# Patient Record
Sex: Male | Born: 1965 | Race: Black or African American | Hispanic: No | Marital: Married | Smoking: Current every day smoker
Health system: Southern US, Community
[De-identification: ages and names within clinical notes are randomized; demographics above are authoritative.]

## PROBLEM LIST (undated history)

## (undated) DIAGNOSIS — I10 Essential (primary) hypertension: Secondary | ICD-10-CM

## (undated) DIAGNOSIS — I472 Ventricular tachycardia, unspecified: Secondary | ICD-10-CM

## (undated) DIAGNOSIS — R002 Palpitations: Secondary | ICD-10-CM

## (undated) DIAGNOSIS — Y249XXA Unspecified firearm discharge, undetermined intent, initial encounter: Secondary | ICD-10-CM

## (undated) DIAGNOSIS — W3400XA Accidental discharge from unspecified firearms or gun, initial encounter: Secondary | ICD-10-CM

## (undated) HISTORY — PX: OTHER SURGICAL HISTORY: SHX169

## (undated) HISTORY — PX: FINGER SURGERY: SHX640

---

## 2013-07-04 ENCOUNTER — Encounter (HOSPITAL_COMMUNITY): Payer: Self-pay | Admitting: Emergency Medicine

## 2013-07-04 ENCOUNTER — Emergency Department (HOSPITAL_COMMUNITY)
Admission: EM | Admit: 2013-07-04 | Discharge: 2013-07-04 | Disposition: A | Discharge status: Home / Self Care | Payer: Self-pay | Attending: Emergency Medicine | Admitting: Emergency Medicine

## 2013-07-04 ENCOUNTER — Emergency Department (HOSPITAL_COMMUNITY): Payer: Self-pay

## 2013-07-04 DIAGNOSIS — F17200 Nicotine dependence, unspecified, uncomplicated: Secondary | ICD-10-CM | POA: Insufficient documentation

## 2013-07-04 DIAGNOSIS — Z87828 Personal history of other (healed) physical injury and trauma: Secondary | ICD-10-CM | POA: Insufficient documentation

## 2013-07-04 DIAGNOSIS — R002 Palpitations: Secondary | ICD-10-CM | POA: Insufficient documentation

## 2013-07-04 DIAGNOSIS — I10 Essential (primary) hypertension: Secondary | ICD-10-CM | POA: Insufficient documentation

## 2013-07-04 DIAGNOSIS — Z79899 Other long term (current) drug therapy: Secondary | ICD-10-CM | POA: Insufficient documentation

## 2013-07-04 HISTORY — DX: Ventricular tachycardia, unspecified: I47.20

## 2013-07-04 HISTORY — DX: Palpitations: R00.2

## 2013-07-04 HISTORY — DX: Accidental discharge from unspecified firearms or gun, initial encounter: W34.00XA

## 2013-07-04 HISTORY — DX: Ventricular tachycardia: I47.2

## 2013-07-04 HISTORY — DX: Essential (primary) hypertension: I10

## 2013-07-04 HISTORY — DX: Unspecified firearm discharge, undetermined intent, initial encounter: Y24.9XXA

## 2013-07-04 LAB — POCT I-STAT, CHEM 8
BUN: 32 mg/dL — ABNORMAL HIGH (ref 6–23)
HCT: 43 % (ref 39.0–52.0)
Hemoglobin: 14.6 g/dL (ref 13.0–17.0)
Sodium: 139 mEq/L (ref 135–145)
TCO2: 24 mmol/L (ref 0–100)

## 2013-07-04 MED ORDER — METOPROLOL TARTRATE 50 MG PO TABS: 50.0000 mg | ORAL_TABLET | Freq: Two times a day (BID) | ORAL | Status: DC

## 2013-07-04 NOTE — ED Notes (Signed)
Patient transported to X-ray 

## 2013-07-04 NOTE — ED Notes (Addendum)
C/o palpitations, takes meds (metoprolol) for the same, ran out of meds, new here to [place] from [place], does not have local PCP, PCP in [place] won't send records, has had palpitations for ~ 2 weeks, started taking half doses of meds 2 weeks ago b/c he knew he would run out. (Denies: nvd, fever, dizziness, pain or sob).

## 2013-07-04 NOTE — ED Notes (Addendum)
Patient presents stating he is here because he has been unable to get a physician here in the area because his physician from [place] has not sent his records to a [place] here and it has been 3 months since requested.  States he becomes anxious when he knows that he is going to run out of his medications.  States he has been having palpitations that come and go.  States the palpitations were strong on the way here but have decreased since arriving here.

## 2013-07-04 NOTE — ED Notes (Addendum)
Pt remains in triage for EKG. Pt alert, NAD, calm, interactive, skin W&D, resps e/u, speaking in clear complete sentences.
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
## 2013-07-04 NOTE — ED Provider Notes (Addendum)
Medical screening examination/treatment/procedure(s) were conducted as a shared visit with resident physician and myself.  I personally evaluated the patient during the encounter. I personally reviewed and interpreted the ecg and agree with the residents interpretation.    I interviewed and examined the patient. Lungs are CTAB. Cardiac exam wnl. Abdomen soft. Denies cp or sob. Pt noting inc palpitations since running out of his metoprolol. Labs/ecg non-contrib. Will provide resources to estab w/ pcp, return precautions given.   Junius Argyle, MD 07/04/13 4696  Junius Argyle, MD 07/16/13 1118

## 2013-11-09 ENCOUNTER — Emergency Department (HOSPITAL_COMMUNITY)
Admission: EM | Admit: 2013-11-09 | Discharge: 2013-11-09 | Disposition: A | Discharge status: Home / Self Care | Payer: Self-pay | Attending: Emergency Medicine | Admitting: Emergency Medicine

## 2013-11-09 ENCOUNTER — Encounter (HOSPITAL_COMMUNITY): Payer: Self-pay | Admitting: Emergency Medicine

## 2013-11-09 DIAGNOSIS — I10 Essential (primary) hypertension: Secondary | ICD-10-CM | POA: Insufficient documentation

## 2013-11-09 DIAGNOSIS — F17200 Nicotine dependence, unspecified, uncomplicated: Secondary | ICD-10-CM | POA: Insufficient documentation

## 2013-11-09 DIAGNOSIS — Z79899 Other long term (current) drug therapy: Secondary | ICD-10-CM | POA: Insufficient documentation

## 2013-11-09 DIAGNOSIS — Z760 Encounter for issue of repeat prescription: Secondary | ICD-10-CM | POA: Insufficient documentation

## 2013-11-09 DIAGNOSIS — R000 Tachycardia, unspecified: Secondary | ICD-10-CM | POA: Insufficient documentation

## 2013-11-09 MED ORDER — METOPROLOL TARTRATE 50 MG PO TABS: 50.0000 mg | ORAL_TABLET | Freq: Two times a day (BID) | ORAL | Status: AC

## 2013-11-09 NOTE — ED Notes (Signed)
Pt presents needing his HTN medication refilled.  Sts he is new to the area and has not been able to find a new PCP.  Pt has been out of his medication x 8 days.
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
## 2013-11-13 NOTE — ED Provider Notes (Signed)
Medical screening examination/treatment/procedure(s) were performed by non-physician practitioner and as supervising physician I was immediately available for consultation/collaboration.  EKG Interpretation   None        Anacaren Kohan, MD 11/13/13 0907 

## 2014-10-14 ENCOUNTER — Ambulatory Visit: Payer: Self-pay

## 2014-10-15 IMAGING — CR DG CHEST 2V
2 series · 2 of 2 positions shown · non-contrast
Comparison: None.

CLINICAL DATA: Chest pain.

EXAM:
CHEST  2 VIEW

[w chest pa]
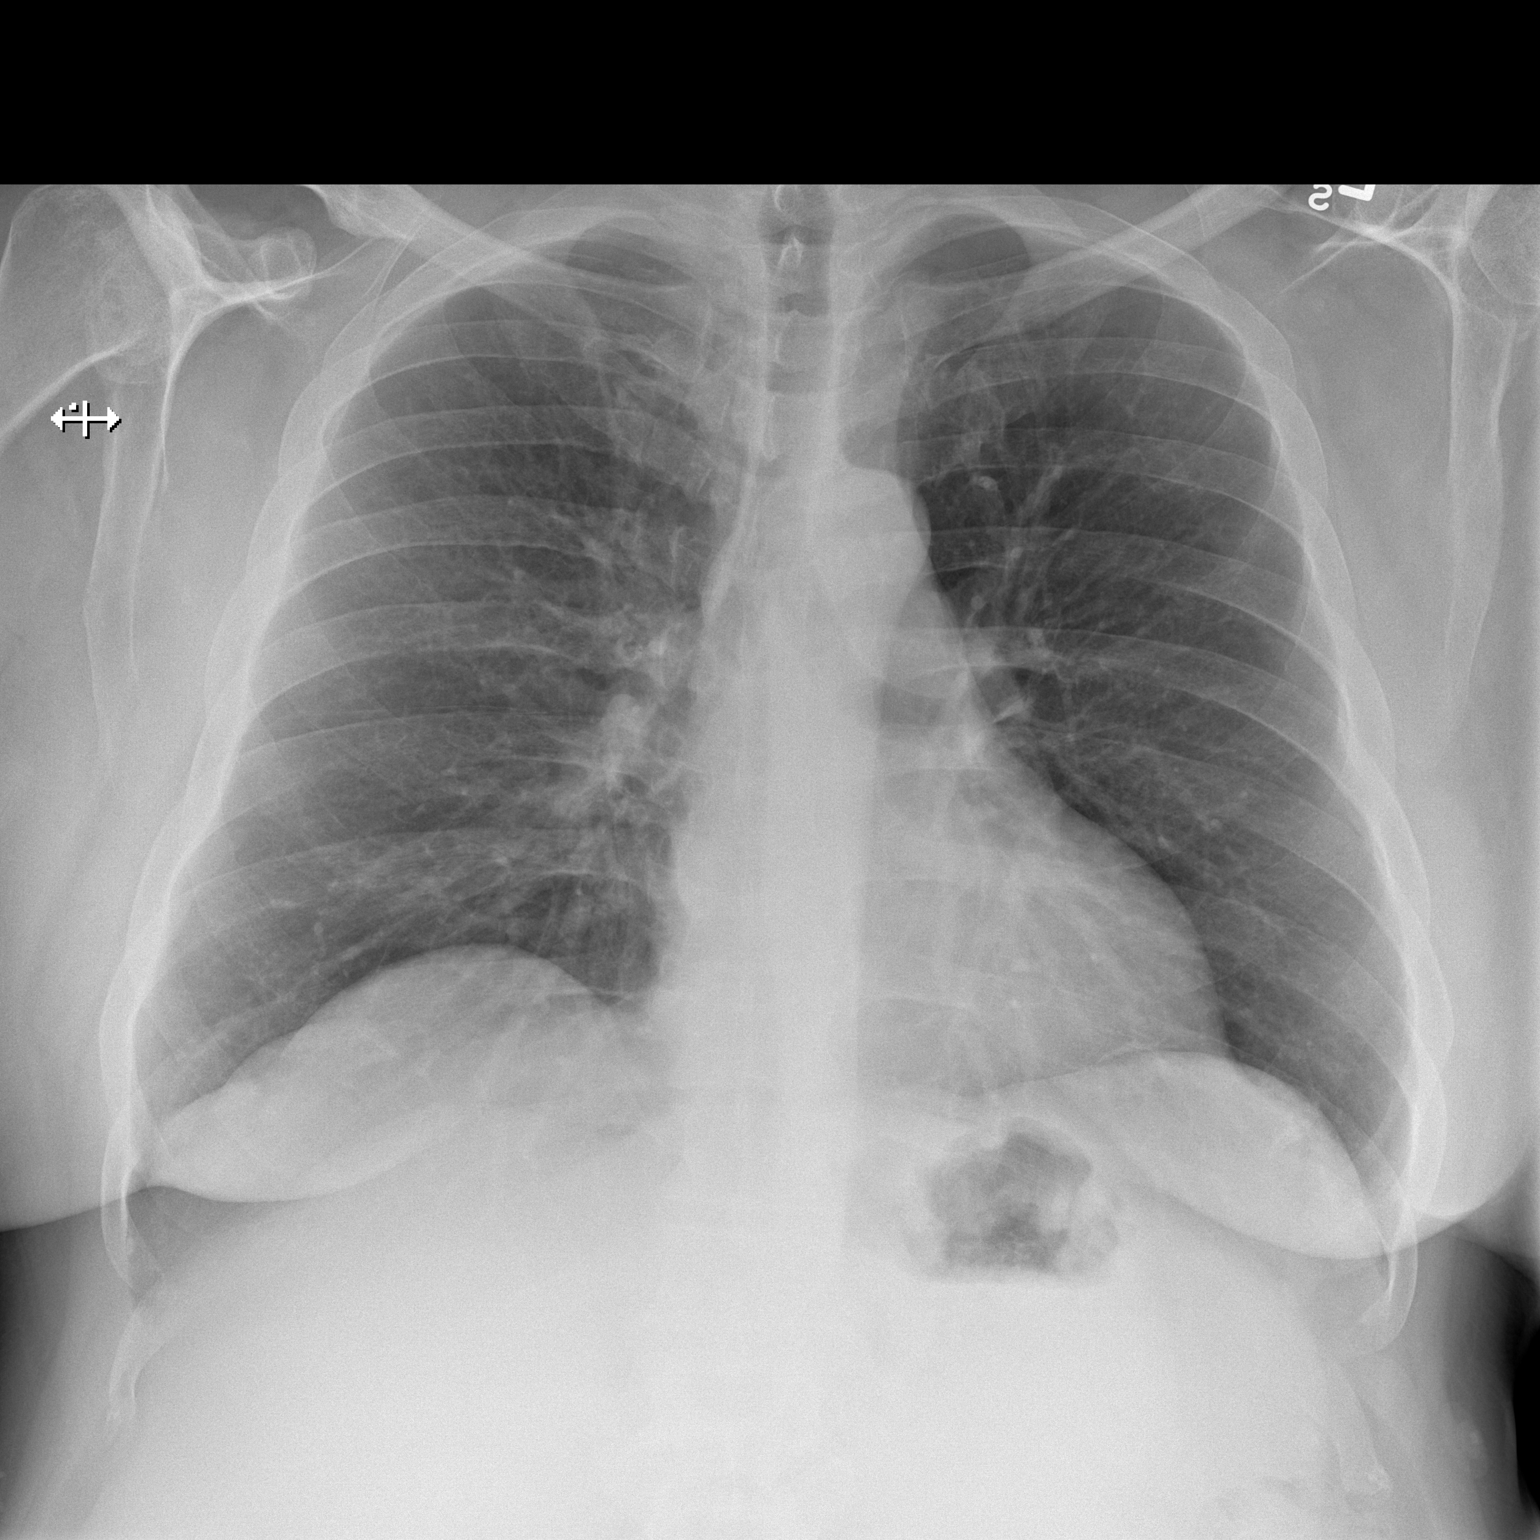

[w chest lat]
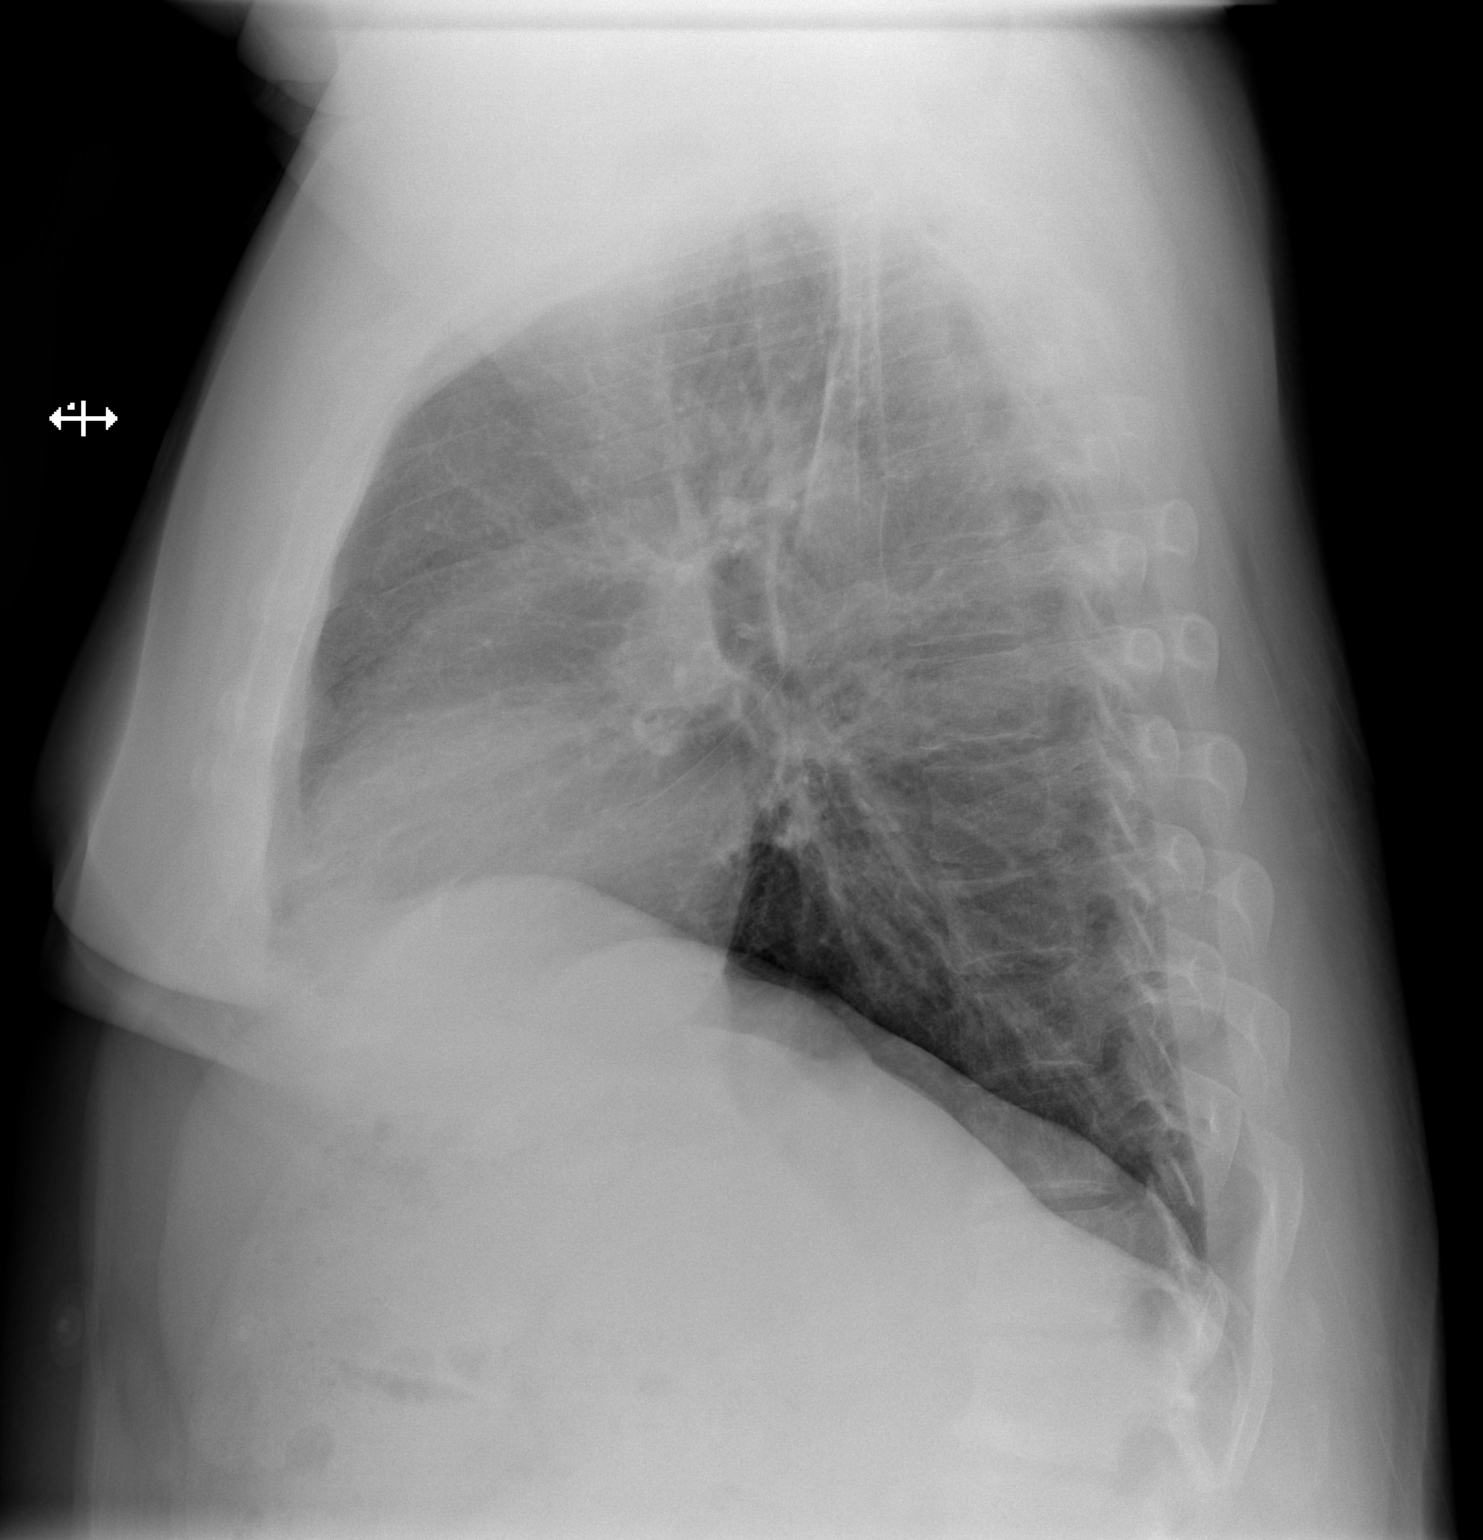

[2 of 2 positions shown; findings below may reference images not displayed]

FINDINGS: Heart size and mediastinal contours are within normal limits. Both
lungs are clear. Visualized skeletal structures are unremarkable.
IMPRESSION: No active cardiopulmonary disease.

## 2014-10-20 ENCOUNTER — Ambulatory Visit: Payer: Self-pay | Attending: Family Medicine

## 2014-11-14 ENCOUNTER — Ambulatory Visit: Payer: Self-pay

## 2018-01-14 ENCOUNTER — Emergency Department
Admission: EM | Admit: 2018-01-14 | Discharge: 2018-01-14 | Disposition: A | Discharge status: Home / Self Care | Payer: BLUE CROSS/BLUE SHIELD | Attending: Emergency Medicine | Admitting: Emergency Medicine

## 2018-01-14 ENCOUNTER — Other Ambulatory Visit: Payer: Self-pay

## 2018-01-14 DIAGNOSIS — F17210 Nicotine dependence, cigarettes, uncomplicated: Secondary | ICD-10-CM | POA: Insufficient documentation

## 2018-01-14 DIAGNOSIS — I10 Essential (primary) hypertension: Secondary | ICD-10-CM | POA: Insufficient documentation

## 2018-01-14 DIAGNOSIS — Z79899 Other long term (current) drug therapy: Secondary | ICD-10-CM | POA: Insufficient documentation

## 2018-01-14 DIAGNOSIS — T783XXA Angioneurotic edema, initial encounter: Secondary | ICD-10-CM | POA: Diagnosis not present

## 2018-01-14 DIAGNOSIS — H02843 Edema of right eye, unspecified eyelid: Secondary | ICD-10-CM | POA: Diagnosis present

## 2018-01-14 MED ORDER — AMLODIPINE BESYLATE 5 MG PO TABS: 5.0000 mg | ORAL_TABLET | Freq: Every day | ORAL | 1 refills | Status: AC

## 2018-01-14 NOTE — Discharge Instructions (Addendum)
Follow-up with your regular doctor.  Stop taking the lisinopril.  Use the amlodipine.  If you have any other issues please return the emergency department

## 2018-01-14 NOTE — ED Notes (Signed)
First Nurse Note:  Right eyelid swollen, patient states he believes it is related to his Lisinopril.
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
## 2018-01-14 NOTE — ED Notes (Signed)
See triage note  Presents with swelling and drainage to right eye    States he recently started back on lisinopril  Thinks it may be an allergic rxn  But also has had recent sinus issues

## 2018-01-14 NOTE — ED Triage Notes (Signed)
Pt c/o intermittent eye swelling with drainage for the past week with drainage.Marland Kitchen..Marland Kitchen

## 2018-06-17 ENCOUNTER — Encounter: Payer: Self-pay | Admitting: Nurse Practitioner

## 2018-06-22 ENCOUNTER — Other Ambulatory Visit: Payer: Self-pay

## 2018-06-22 DIAGNOSIS — Z1211 Encounter for screening for malignant neoplasm of colon: Secondary | ICD-10-CM

## 2018-07-16 ENCOUNTER — Telehealth: Payer: Self-pay

## 2018-07-16 NOTE — Telephone Encounter (Signed)
Patient contacted by Rosann Auerbach in Endoscopy regarding colonoscopy time.  Patient informed Rosann Auerbach that his colonoscopy date had been changed to 11/12.  I don't have any documentation that the patient called to rescheduled and his instructions are dated for tomorrow.  I LVM to inform him that his letter reflects the tomorrows date and that there is a $100 cancellation fee.  Thanks [employer]

## 2018-07-17 ENCOUNTER — Ambulatory Visit
Admission: RE | Admit: 2018-07-17 | Payer: BLUE CROSS/BLUE SHIELD | Source: Ambulatory Visit | Admitting: Gastroenterology

## 2018-07-17 ENCOUNTER — Encounter: Admission: RE | Payer: Self-pay | Source: Ambulatory Visit

## 2018-07-17 SURGERY — COLONOSCOPY WITH PROPOFOL
Anesthesia: General

## 2018-07-31 ENCOUNTER — Encounter: Payer: Self-pay | Admitting: Gastroenterology

## 2018-07-31 ENCOUNTER — Ambulatory Visit: Payer: BLUE CROSS/BLUE SHIELD | Admitting: Gastroenterology

## 2018-07-31 DIAGNOSIS — Z1211 Encounter for screening for malignant neoplasm of colon: Secondary | ICD-10-CM

## 2018-08-03 ENCOUNTER — Ambulatory Visit: Payer: BLUE CROSS/BLUE SHIELD | Admitting: Gastroenterology
